# Patient Record
Sex: Female | Born: 1985 | Hispanic: Yes | Marital: Single | Smoking: Never smoker
Health system: Southern US, Community
[De-identification: ages and names within clinical notes are randomized; demographics above are authoritative.]

---

## 2008-05-08 ENCOUNTER — Ambulatory Visit: Payer: Self-pay | Admitting: Family Medicine

## 2010-01-04 ENCOUNTER — Ambulatory Visit: Payer: Self-pay | Admitting: Family Medicine

## 2013-06-28 ENCOUNTER — Inpatient Hospital Stay: Payer: Self-pay | Admitting: Obstetrics and Gynecology

## 2013-06-28 LAB — CBC WITH DIFFERENTIAL/PLATELET
Eosinophil #: 0 10*3/uL (ref 0.0–0.7)
HCT: 37 % (ref 35.0–47.0)
MCH: 30 pg (ref 26.0–34.0)
MCV: 89 fL (ref 80–100)
Monocyte #: 0.5 x10 3/mm (ref 0.2–0.9)
Neutrophil #: 5.7 10*3/uL (ref 1.4–6.5)
Neutrophil %: 66.6 %
Platelet: 249 10*3/uL (ref 150–440)
RBC: 4.19 10*6/uL (ref 3.80–5.20)
WBC: 8.5 10*3/uL (ref 3.6–11.0)

## 2013-06-29 LAB — HEMATOCRIT: HCT: 35.2 %
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
## 2018-11-19 ENCOUNTER — Other Ambulatory Visit: Payer: Self-pay | Admitting: Family Medicine

## 2018-11-19 DIAGNOSIS — Z3482 Encounter for supervision of other normal pregnancy, second trimester: Secondary | ICD-10-CM

## 2018-12-26 ENCOUNTER — Other Ambulatory Visit: Payer: Self-pay

## 2018-12-26 ENCOUNTER — Ambulatory Visit
Admission: RE | Admit: 2018-12-26 | Discharge: 2018-12-26 | Disposition: A | Discharge status: Home / Self Care | Payer: No Typology Code available for payment source | Source: Ambulatory Visit | Attending: Family Medicine | Admitting: Family Medicine

## 2018-12-26 DIAGNOSIS — Z3482 Encounter for supervision of other normal pregnancy, second trimester: Secondary | ICD-10-CM

## 2019-01-14 ENCOUNTER — Ambulatory Visit: Payer: Self-pay

## 2019-01-17 ENCOUNTER — Ambulatory Visit: Payer: Self-pay

## 2019-01-23 ENCOUNTER — Other Ambulatory Visit: Payer: Self-pay | Admitting: Family Medicine

## 2019-02-06 ENCOUNTER — Other Ambulatory Visit: Payer: Self-pay | Admitting: Family Medicine

## 2019-02-07 ENCOUNTER — Other Ambulatory Visit: Payer: Self-pay | Admitting: Family Medicine

## 2019-02-07 DIAGNOSIS — Z3483 Encounter for supervision of other normal pregnancy, third trimester: Secondary | ICD-10-CM

## 2019-03-04 ENCOUNTER — Other Ambulatory Visit: Payer: Self-pay

## 2019-03-04 ENCOUNTER — Ambulatory Visit
Admission: RE | Admit: 2019-03-04 | Discharge: 2019-03-04 | Disposition: A | Discharge status: Home / Self Care | Payer: No Typology Code available for payment source | Source: Ambulatory Visit | Attending: Family Medicine | Admitting: Family Medicine

## 2019-03-04 DIAGNOSIS — Z3483 Encounter for supervision of other normal pregnancy, third trimester: Secondary | ICD-10-CM | POA: Insufficient documentation

## 2019-05-06 ENCOUNTER — Other Ambulatory Visit: Payer: Self-pay | Admitting: Family Medicine

## 2019-12-12 ENCOUNTER — Ambulatory Visit: Payer: No Typology Code available for payment source

## 2019-12-12 ENCOUNTER — Ambulatory Visit: Payer: No Typology Code available for payment source | Attending: Internal Medicine

## 2019-12-12 DIAGNOSIS — Z23 Encounter for immunization: Secondary | ICD-10-CM

## 2019-12-12 NOTE — Progress Notes (Signed)
   Covid-19 Vaccination Clinic  Name:  Debra Green    MRN: [id number] DOB: [birth year]  12/12/2019  Debra Green was observed post Covid-19 immunization for 15 minutes without incident. She was provided with Vaccine Information Sheet and instruction to access the V-Safe system.   Debra Green was instructed to call 911 with any severe reactions post vaccine: Marland Kitchen Difficulty breathing  . Swelling of face and throat  . A fast heartbeat  . A bad rash all over body  . Dizziness and weakness   Immunizations Administered    Name Date Dose VIS Date Route   Pfizer COVID-19 Vaccine 12/12/2019  8:16 AM 0.3 mL 10/08/2018 Intramuscular   Manufacturer: [employer], [employer]   Lot: [id number]   NDC: [address]-4

## 2019-12-19 ENCOUNTER — Other Ambulatory Visit: Payer: Self-pay | Admitting: Family Medicine

## 2019-12-19 DIAGNOSIS — R220 Localized swelling, mass and lump, head: Secondary | ICD-10-CM

## 2019-12-25 ENCOUNTER — Ambulatory Visit (HOSPITAL_COMMUNITY): Payer: BC Managed Care – PPO

## 2019-12-25 ENCOUNTER — Ambulatory Visit
Admission: RE | Admit: 2019-12-25 | Discharge: 2019-12-25 | Disposition: A | Discharge status: Home / Self Care | Payer: BC Managed Care – PPO | Source: Ambulatory Visit | Attending: Family Medicine | Admitting: Family Medicine

## 2019-12-25 ENCOUNTER — Other Ambulatory Visit: Payer: Self-pay

## 2019-12-25 DIAGNOSIS — R220 Localized swelling, mass and lump, head: Secondary | ICD-10-CM | POA: Diagnosis not present

## 2020-01-06 ENCOUNTER — Ambulatory Visit: Payer: BC Managed Care – PPO | Attending: Internal Medicine

## 2020-01-06 DIAGNOSIS — Z23 Encounter for immunization: Secondary | ICD-10-CM

## 2020-01-06 NOTE — Progress Notes (Signed)
   Covid-19 Vaccination Clinic  Name:  Marquelle Balow    MRN: [id number] DOB: 1985-11-02  01/06/2020  Ms. Makeya Hilgert was observed post Covid-19 immunization for 15 minutes without incident. She was provided with Vaccine Information Sheet and instruction to access the V-Safe system.   Ms. Zephyra Bernardi was instructed to call 911 with any severe reactions post vaccine: Marland Kitchen Difficulty breathing  . Swelling of face and throat  . A fast heartbeat  . A bad rash all over body  . Dizziness and weakness   Immunizations Administered    Name Date Dose VIS Date Route   Pfizer COVID-19 Vaccine 01/06/2020  3:36 PM 0.3 mL 10/08/2018 Intramuscular   Manufacturer: [employer], [employer]   Lot: [id number]   NDC: [address]-5

## 2022-04-18 IMAGING — US US SOFT TISSUE HEAD/NECK
1 series · 12 of 12 positions shown · non-contrast
Comparison: None.

CLINICAL DATA: Submandibular mass.

EXAM:
ULTRASOUND OF HEAD/NECK SOFT TISSUES
TECHNIQUE: Ultrasound examination of the head and neck soft tissues was
performed in the area of clinical concern.

[Series 1: us soft tissue head/neck · 0.06mm/px · 12 of 12 slices shown]
[im 1/12]
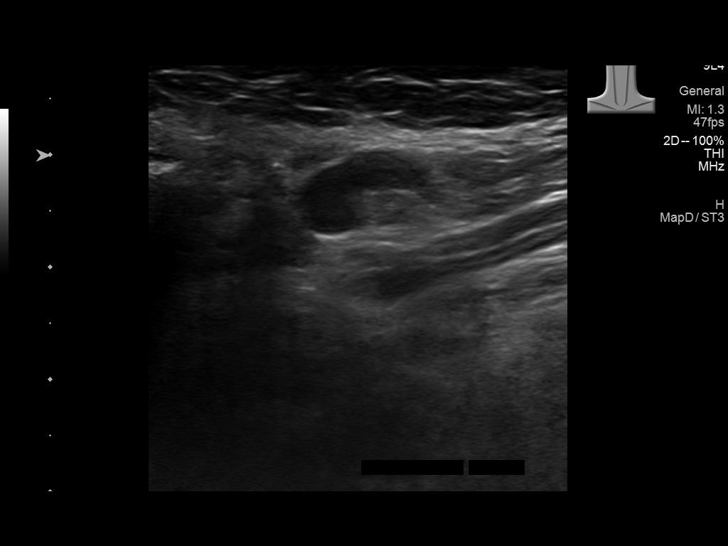
[im 2/12]
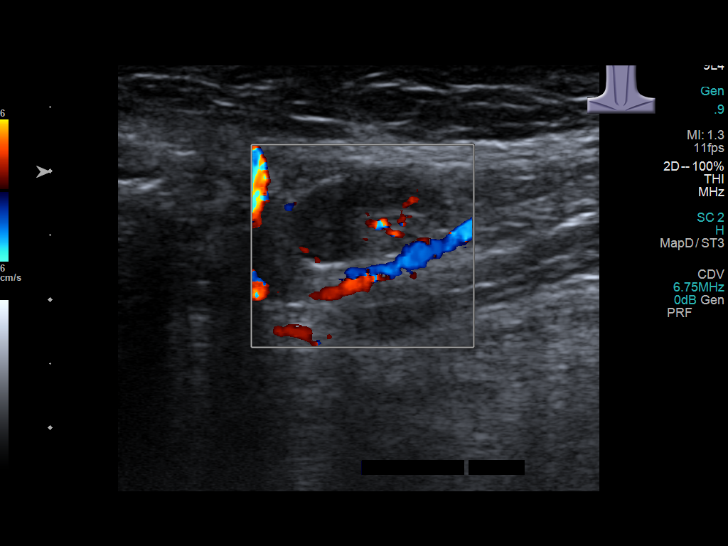
[im 3/12]
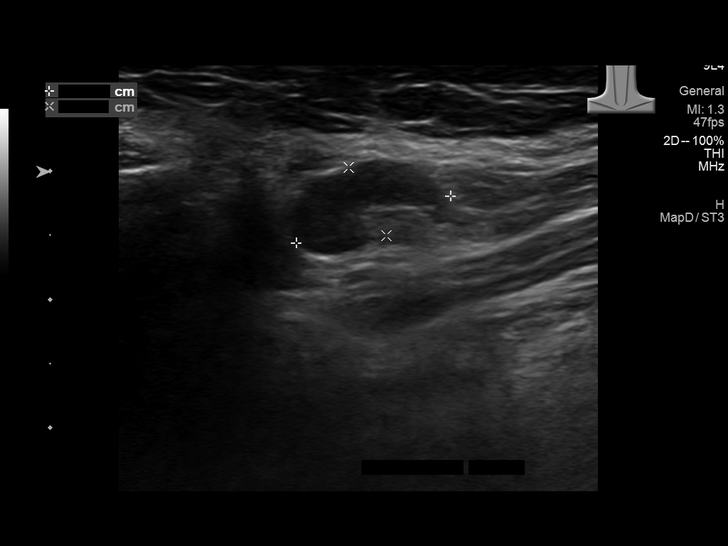
[im 4/12]
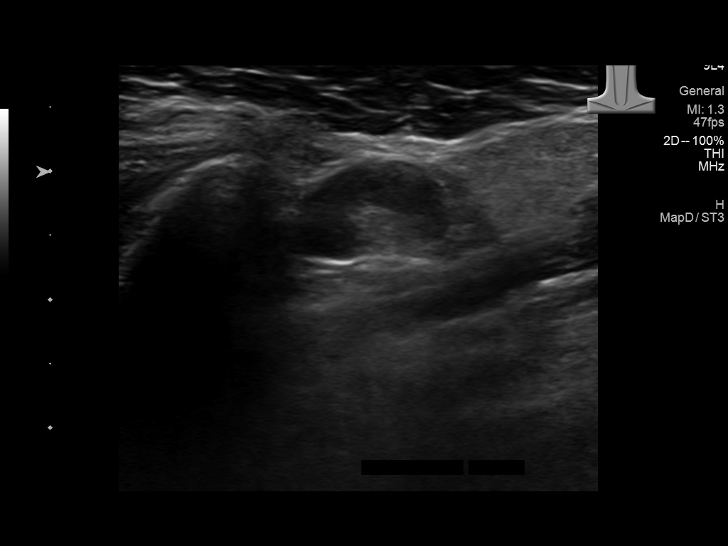
[im 5/12]
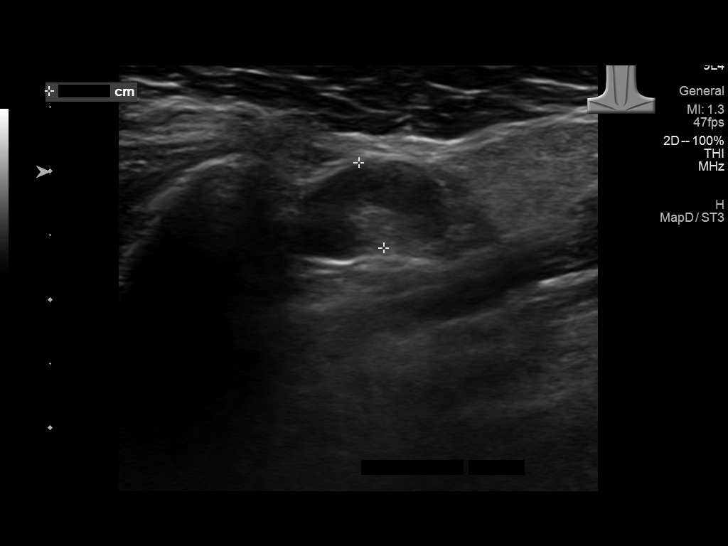
[im 6/12]
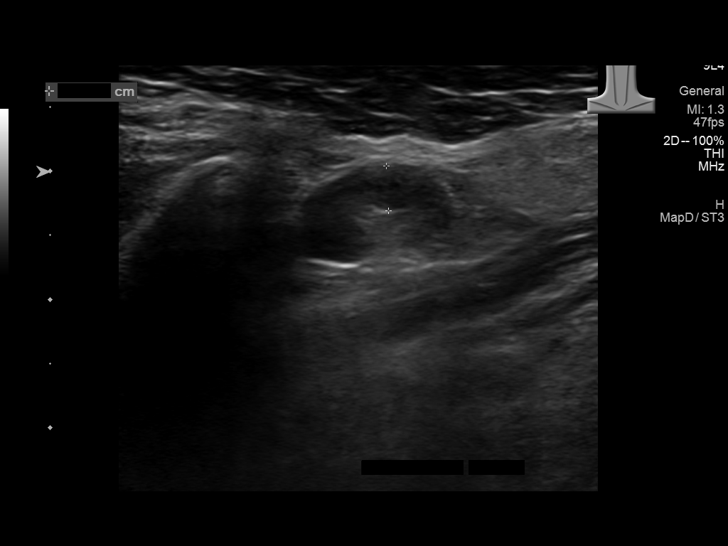
[im 7/12]
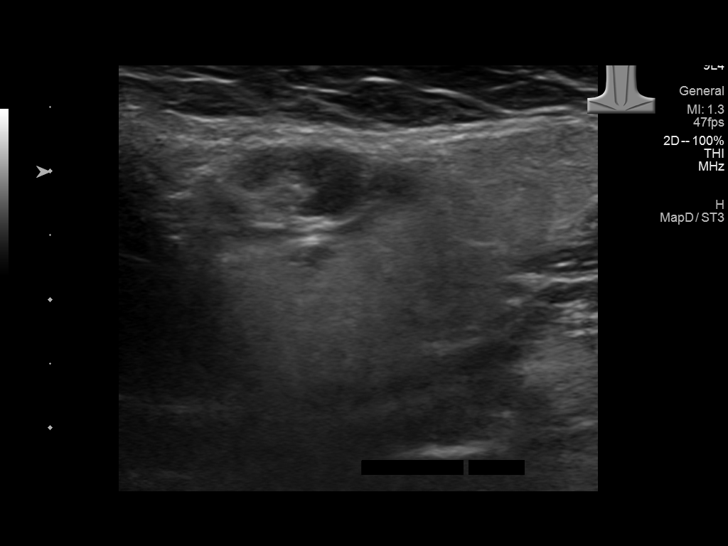
[im 8/12]
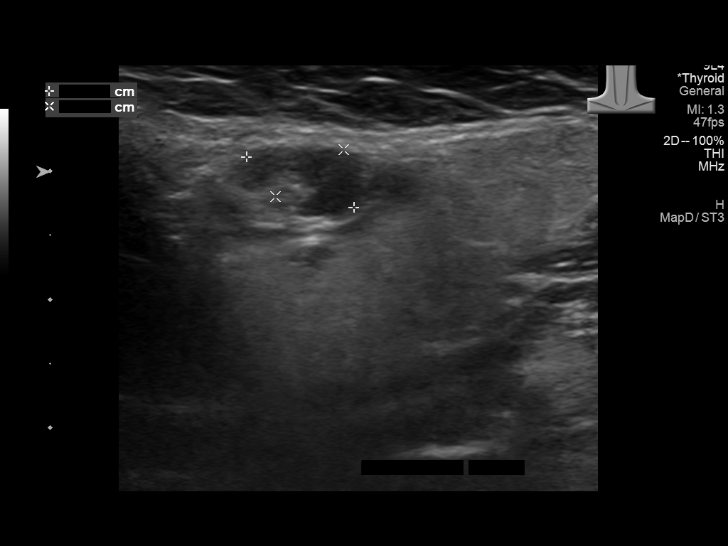
[im 9/12]
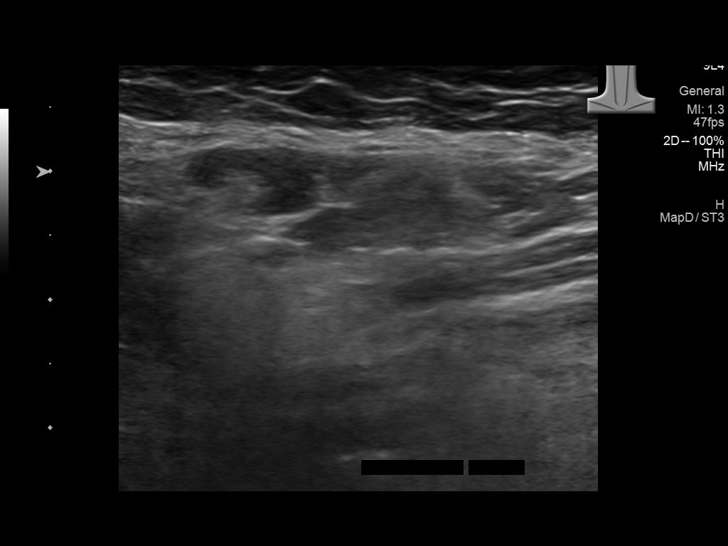
[im 10/12]
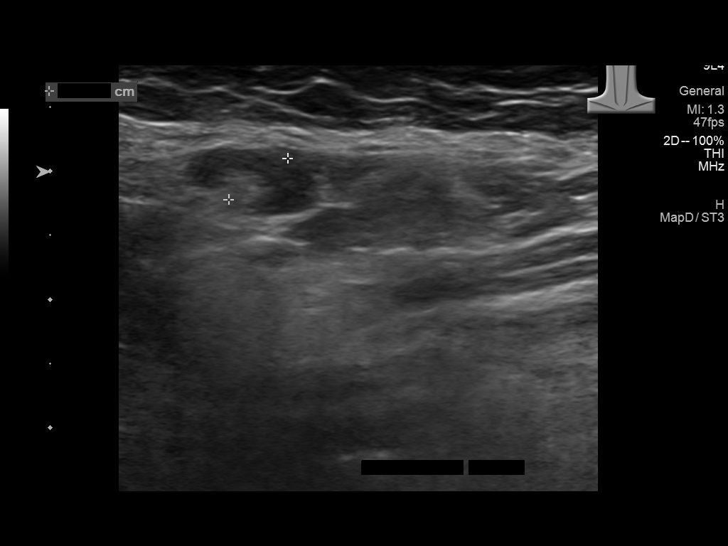
[im 11/12]
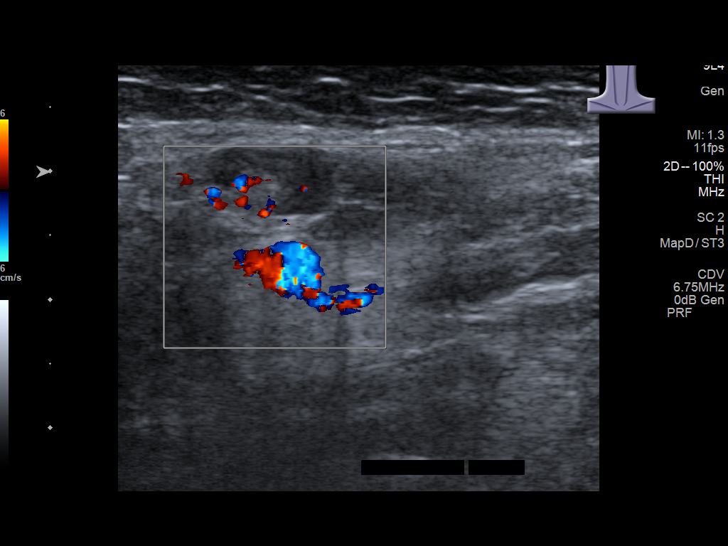
[im 12/12]
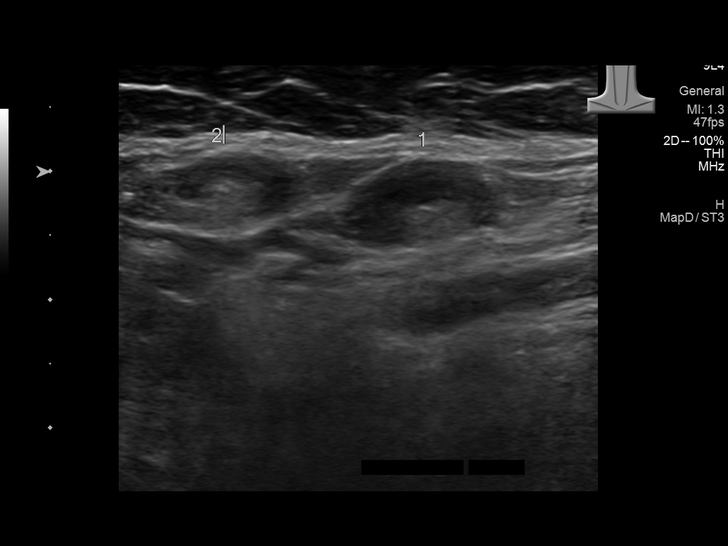

[12 of 12 positions shown; findings below may reference images not displayed]

FINDINGS: In the area of concern there are 2 reniform nodules consistent with
lymph nodes, 11 x 6 mm in maximum. Cortex is homogeneous. No
adjacent inflammatory changes or collection.
IMPRESSION: Palpable concern reflects 2 mildly enlarged right submandibular
lymph nodes. Please correlate for oral or facial inflammation and
recommend clinical follow-up.

## 2023-12-05 ENCOUNTER — Ambulatory Visit: Payer: Self-pay | Admitting: Family Medicine

## 2023-12-05 ENCOUNTER — Encounter: Payer: Self-pay | Admitting: Family Medicine

## 2023-12-05 VITALS — BP 137/90 | HR 102 | Wt 175.0 lb

## 2023-12-05 DIAGNOSIS — Z3046 Encounter for surveillance of implantable subdermal contraceptive: Secondary | ICD-10-CM

## 2023-12-05 DIAGNOSIS — Z30011 Encounter for initial prescription of contraceptive pills: Secondary | ICD-10-CM | POA: Diagnosis not present

## 2023-12-05 DIAGNOSIS — Z308 Encounter for other contraceptive management: Secondary | ICD-10-CM

## 2023-12-05 DIAGNOSIS — Z124 Encounter for screening for malignant neoplasm of cervix: Secondary | ICD-10-CM

## 2023-12-05 DIAGNOSIS — Z113 Encounter for screening for infections with a predominantly sexual mode of transmission: Secondary | ICD-10-CM

## 2023-12-05 LAB — WET PREP FOR TRICH, YEAST, CLUE
Trichomonas Exam: NEGATIVE
Yeast Exam: NEGATIVE

## 2023-12-05 MED ORDER — NORGESTIMATE-ETH ESTRADIOL 0.25-35 MG-MCG PO TABS: 1.0000 | ORAL_TABLET | Freq: Every day | ORAL | 4 refills | Status: AC

## 2023-12-05 NOTE — Patient Instructions (Addendum)
 PAP Smear Today we performed a PAP smear to screen for cervical cancer. The results should be back in 1-2 weeks.  Once we have the results we can determine when your next screening should be.    STI screening - Today we obtained a vaginal swab to screen for gonorrhea, chlamydia, and trichomonas - If the results are abnormal, I will give you a call.    Estimated time frame for results collected at the [facility] Department: Same day Trichomonas Yeast BV (bacterial vaginosis)  Within 1-2 weeks Gonorrhea Chlamydia  Within 2-3 weeks HIV Syphilis Hepatitis B Hepatitis C    Nexplanon removal  Today we removed your Nexplanon implant.   After care: Women may have discomfort and some bruising following the removal of Nexplanon. Wear your pressure bandage for a full 24 hours, then an adhesive bandage for 3-5 days. Allow the wound closure strips to fall off naturally with routine showering - do not remove forcefully.   Reasons to seek care: Fever and chills Swelling and redness of the Nexplanon site Abnormal or foul drainage from the Nexplanon removal site  Contraception:  You may become pregnant as early as a week after the removal of Nexplanon. After Nexplanon is removed, and if you do not wish to get pregnant at this time, you should start another birth control method, such as condoms, right away. If you want to get pregnant, please consult with your doctor.  Birth control Today we decided on birth control pills have contain both estrogen and progesterone.  You can start this right away to provide birth control coverage.

## 2023-12-05 NOTE — Progress Notes (Signed)
 [employer] [facility] [address], [address] [place] [address] Main phone: [phone]  Family Planning Visit - Initial Visit  Subjective:  Debra Green is a 38 y.o.  G3P3003   being seen today for an initial annual visit and to discuss reproductive life planning.  The patient is currently using hormonal implant for pregnancy prevention. Patient does not want a pregnancy in the next year.   Patient reports they are looking for a method with the following characteristics:  Ready when they are Method they can control starting and stopping  Patient has the following medical conditions: There are no active problems to display for this patient.  Chief Complaint  Patient presents with   Annual Exam    Pt is here PE and Nexplanon removal   HPI Patient reports desire for physical exam and nexplanon removal. Is due for a PAP and is amenable to getting this today.   Patient denies HTN, blood clots, personal history of cancer, migraine with aura, stroke, congenital clotting disorders, or smoking.  Review of Systems  Constitutional:  Negative for fever, malaise/fatigue and weight loss.  Respiratory:  Negative for shortness of breath.   Cardiovascular:  Negative for chest pain and palpitations.   Diabetes screening This patient is 38 y.o. with a BMI of There is no height or weight on file to calculate BMI..  Is patient eligible for diabetes screening (age >35 and BMI >25)?  not applicable  Was Hgb A1c ordered? no  STI screening Patient reports 1 of partners in last year.  Does this patient desire STI screening?  No - declines  Hepatitis C screening Has patient been screened once for HCV in the past?  No  No results found for: "HCVAB"  Does the patient meet criteria for HCV testing? No   Hepatitis B screening Does the patient meet criteria for HBV testing? No  Cervical Cancer Screening  No Cervical Cancer Screening  results to display.  Health Maintenance Due  Topic Date Due   HIV Screening  Never done   Hepatitis C Screening  Never done   DTaP/Tdap/Td (1 - Tdap) Never done   Cervical Cancer Screening (HPV/Pap Cotest)  Never done   COVID-19 Vaccine (3 - 2024-25 season) 04/15/2023   The following portions of the patient's history were reviewed and updated as appropriate: allergies, current medications, past family history, past medical history, past social history, past surgical history and problem list. Problem list updated.  See flowsheet for other program required questions.  Objective:   Vitals:   12/05/23 1508  BP: (!) 137/90  Pulse: (!) 102  Weight: 175 lb (79.4 kg)   Physical Exam Vitals and nursing note reviewed. Exam conducted with a chaperone present Memory Staggers, RN).  Constitutional:      Appearance: Normal appearance.  HENT:     Head: Normocephalic and atraumatic.     Mouth/Throat:     Mouth: Mucous membranes are moist.     Pharynx: Oropharynx is clear. No oropharyngeal exudate or posterior oropharyngeal erythema.  Eyes:     General: No scleral icterus.       Right eye: No discharge.        Left eye: No discharge.     Conjunctiva/sclera: Conjunctivae normal.  Cardiovascular:     Rate and Rhythm: Normal rate and regular rhythm.     Heart sounds: Normal heart sounds. No murmur heard.    No gallop.  Pulmonary:  Effort: Pulmonary effort is normal.  Abdominal:     General: Abdomen is flat.     Palpations: Abdomen is soft. There is no mass.     Tenderness: There is no abdominal tenderness. There is no rebound.  Genitourinary:    General: Normal vulva.     Exam position: Lithotomy position.     Pubic Area: No rash or pubic lice.      Tanner stage (genital): 5.     Labia:        Right: No rash or lesion.        Left: No rash or lesion.      Vagina: Normal. No vaginal discharge, erythema, bleeding or lesions.     Cervix: Normal. No cervical motion tenderness,  discharge, friability, lesion or erythema.     Uterus: Normal.   Musculoskeletal:        General: Normal range of motion.     Cervical back: Neck supple. No rigidity or tenderness.  Lymphadenopathy:     Head:     Right side of head: No preauricular or posterior auricular adenopathy.     Left side of head: No preauricular or posterior auricular adenopathy.     Cervical: No cervical adenopathy.     Right cervical: No superficial or posterior cervical adenopathy.    Left cervical: No superficial or posterior cervical adenopathy.     Upper Body:     Right upper body: No supraclavicular adenopathy.     Left upper body: No supraclavicular adenopathy.  Skin:    General: Skin is warm and dry.     Capillary Refill: Capillary refill takes less than 2 seconds.     Coloration: Skin is not jaundiced.     Findings: No bruising, erythema, lesion or rash.  Neurological:     General: No focal deficit present.     Mental Status: She is alert and oriented to person, place, and time.  Psychiatric:        Mood and Affect: Mood normal.        Behavior: Behavior normal.    Procedure:   Nexplanon Removal Patient identified, informed consent performed, consent signed.   Appropriate time out taken. Nexplanon site identified.  Area prepped in usual sterile fashon. 3 ml of 1% lidocaine with Epinephrine was used to anesthetize the area at the distal end of the implant and along implant site. A small stab incision was made right beside the implant on the distal portion.  The Nexplanon rod was grasped using hemostats and removed without difficulty.  There was minimal blood loss. There were no complications.  Steri-strips were applied over the small incision.  A pressure bandage was applied to reduce any bruising.  The patient tolerated the procedure well and was given post procedure instructions.    Assessment and Plan:  Debra Green is a 38 y.o. female presenting to the [facility]  Department for an initial annual wellness/contraceptive visit  Contraception counseling: Reviewed options based on patient desire and reproductive life plan. Patient is interested in Oral Contraceptive. This was provided to the patient today.  Risks, benefits, and typical effectiveness rates were reviewed.  Questions were answered.  Written information was also given to the patient to review.    The patient will follow up in  1 years for surveillance.  The patient was told to call with any further questions, or with any concerns about this method of contraception.  Emphasized use of condoms 100% of the time  for STI prevention.  Educated on ECP and assessed for need of ECP. Patient does not met criteria.   Cervical cancer screening -     IGP, Aptima HPV  Nexplanon removal  Screening examination for venereal disease -     WET PREP FOR TRICH, YEAST, CLUE -     Chlamydia/Gonorrhea [facility]  Encounter for initial prescription of contraceptive pills -     Norgestimate -Eth Estradiol ; Take 1 tablet by mouth daily.  Dispense: 84 tablet; Refill: 4    No follow-ups on file.  No future appointments.   Jack Marts, MD

## 2023-12-05 NOTE — Progress Notes (Signed)
 Patient is here for PE and Nexplanon removal. FP packet given to patient and contents reviewed with the patient. Nexplanon removed successfully from the Lt arm by Tempie Fee, MD. and pt tolerated well to the removal process. Pt has been prescribed ortho cyclen to be picked up at the Pharmacy. Condoms declined. Austine Lefort, RN.

## 2023-12-11 LAB — IGP, APTIMA HPV
HPV Aptima: NEGATIVE
PAP Smear Comment: 0

## 2023-12-14 NOTE — Progress Notes (Signed)
 PAP smear reviewed, result: NILM (normal), HPV negative. Based on this result and patient's prior cervical cancer screenings, [facility] currently recommends repeat PAP smear in 5 years with HPV co-testing.  Tempie Fee, MD 12/14/23  2:12 PM

## 2023-12-25 ENCOUNTER — Ambulatory Visit: Payer: Self-pay
